# Patient Record
Sex: Male | Born: 2000 | Race: White | Hispanic: No | Marital: Single | Smoking: Current every day smoker
Health system: Southern US, Community
[De-identification: ages and names within clinical notes are randomized; demographics above are authoritative.]

## PROBLEM LIST (undated history)

## (undated) DIAGNOSIS — F909 Attention-deficit hyperactivity disorder, unspecified type: Secondary | ICD-10-CM

## (undated) DIAGNOSIS — F988 Other specified behavioral and emotional disorders with onset usually occurring in childhood and adolescence: Secondary | ICD-10-CM

---
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
## 2019-08-26 ENCOUNTER — Emergency Department (HOSPITAL_COMMUNITY)
Admission: EM | Admit: 2019-08-26 | Discharge: 2019-08-27 | Disposition: A | Discharge status: Home / Self Care | Payer: Medicaid Other | Attending: Emergency Medicine | Admitting: Emergency Medicine

## 2019-08-26 ENCOUNTER — Encounter (HOSPITAL_COMMUNITY): Payer: Self-pay | Admitting: Emergency Medicine

## 2019-08-26 ENCOUNTER — Other Ambulatory Visit: Payer: Self-pay

## 2019-08-26 DIAGNOSIS — F17210 Nicotine dependence, cigarettes, uncomplicated: Secondary | ICD-10-CM | POA: Diagnosis not present

## 2019-08-26 DIAGNOSIS — Z91010 Allergy to peanuts: Secondary | ICD-10-CM | POA: Insufficient documentation

## 2019-08-26 DIAGNOSIS — Z79899 Other long term (current) drug therapy: Secondary | ICD-10-CM | POA: Insufficient documentation

## 2019-08-26 DIAGNOSIS — K1230 Oral mucositis (ulcerative), unspecified: Secondary | ICD-10-CM | POA: Insufficient documentation

## 2019-08-26 DIAGNOSIS — R030 Elevated blood-pressure reading, without diagnosis of hypertension: Secondary | ICD-10-CM | POA: Diagnosis not present

## 2019-08-26 DIAGNOSIS — K121 Other forms of stomatitis: Secondary | ICD-10-CM

## 2019-08-26 DIAGNOSIS — R220 Localized swelling, mass and lump, head: Secondary | ICD-10-CM | POA: Diagnosis present

## 2019-08-26 NOTE — ED Triage Notes (Signed)
Pt reports lower lip swelling after chewing on his lip in his sleep.

## 2019-08-27 MED ORDER — AMOXICILLIN 500 MG PO CAPS: 1000.0000 mg | ORAL_CAPSULE | Freq: Two times a day (BID) | ORAL | 0 refills | Status: AC

## 2019-08-27 MED ORDER — AMOXICILLIN 250 MG PO CAPS
1000.0000 mg | ORAL_CAPSULE | Freq: Once | ORAL | Status: AC
  Administered 2019-08-27: 1000 mg via ORAL
  Filled 2019-08-27: qty 4

## 2019-08-27 NOTE — Discharge Instructions (Signed)
Suck on popsicles or other cool to cold items.    You may use topical anesthetics that are normally used for dental pain.  Your blood pressure was a little high today.  Please have it rechecked several times in the next 2 weeks.  If it stays high, you would need to be treated for it.

## 2019-08-27 NOTE — ED Provider Notes (Signed)
[facility] EMERGENCY DEPARTMENT Provider Note   CSN: [id number] Arrival date & time: 08/26/19  2118    History Chief Complaint  Patient presents with   Oral Swelling    John Cummings is a 19 y.o. male.  The history is provided by the patient.  He has history of attention deficit disorder and comes in having chewed his lower lip and his sleep causing an ulceration.  This occurred yesterday.  He came in today because of worsening pain.  He rates pain at 9/10.  He denies other injury.  He denies fever or chills.  Past Medical History:  Diagnosis Date   ADD (attention deficit disorder)    ADHD (attention deficit hyperactivity disorder)     There are no problems to display for this patient.   History reviewed. No pertinent surgical history.     No family history on file.  Social History   Tobacco Use   Smoking status: Current Every Day Smoker    Packs/day: 1.00    Types: Cigarettes   Smokeless tobacco: Never Used  Substance Use Topics   Alcohol use: No   Drug use: No    Home Medications Prior to Admission medications   Medication Sig Start Date End Date Taking? Authorizing Provider  ADDERALL XR 20 MG 24 hr capsule Take 20 mg by mouth 2 (two) times daily. 12/05/15   [provider]  ibuprofen (ADVIL,MOTRIN) 400 MG tablet Take 1 tablet (400 mg total) by mouth every 6 (six) hours as needed. 01/04/16   Ivery Quale, PA-C  methocarbamol (ROBAXIN) 500 MG tablet 1 at hs , or q6h prn spasm pain. 01/04/16   Ivery Quale, PA-C  sertraline (ZOLOFT) 50 MG tablet Take 50 mg by mouth daily. 11/05/15   [provider]    Allergies    Peanut-containing drug products  Review of Systems   Review of Systems  All other systems reviewed and are negative.   Physical Exam Updated Vital Signs BP (!) 132/99 (BP Location: Right Arm)    Pulse 100    Temp 98.3 F (36.8 C) (Oral)    Resp 19    Ht 6\' 2"  (1.88 m)    Wt 61.2 kg    SpO2 98%    BMI 17.33 kg/m    Physical Exam Vitals and nursing note reviewed.   19 year old male, resting comfortably and in no acute distress. Vital signs are significant for elevated diastolic blood pressure. Oxygen saturation is 98%, which is normal. Head is normocephalic and atraumatic. PERRLA, EOMI. there is approximately 1 cm ulceration of the mucosa of the left side of the lower lip. Neck is nontender and supple without adenopathy or JVD. Back is nontender and there is no CVA tenderness. Lungs are clear without rales, wheezes, or rhonchi. Chest is nontender. Heart has regular rate and rhythm without murmur. Abdomen is soft, flat, nontender without masses or hepatosplenomegaly and peristalsis is normoactive. Extremities have no cyanosis or edema, full range of motion is present. Skin is warm and dry without rash. Neurologic: Mental status is normal, cranial nerves are intact, there are no motor or sensory deficits.  ED Results / Procedures / Treatments    Procedures Procedures   Medications Ordered in ED Medications  amoxicillin (AMOXIL) capsule 1,000 mg (1,000 mg Oral Given 08/27/19 0045)    ED Course  I have reviewed the triage vital signs and the nursing notes.  MDM Rules/Calculators/A&P Oral ulceration, apparently secondary to patient chewing on his lip.  Appearance is not that of a viral aphthous ulcer.  He is given a prescription for amoxicillin and told to use topical anesthetics.  Mild elevation of blood pressure, advised to have it rechecked as an outpatient.  Final Clinical Impression(s) / ED Diagnoses Final diagnoses:  Oral ulceration  Elevated blood-pressure reading without diagnosis of hypertension    Rx / DC Orders ED Discharge Orders         Ordered    amoxicillin (AMOXIL) 500 MG capsule  2 times daily     08/27/19 2725           Delora Fuel, MD 36/64/40 [phone]
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
## 2023-09-25 ENCOUNTER — Encounter (HOSPITAL_COMMUNITY): Payer: Self-pay | Admitting: Emergency Medicine

## 2023-09-25 ENCOUNTER — Emergency Department (HOSPITAL_COMMUNITY)
Admission: EM | Admit: 2023-09-25 | Discharge: 2023-09-25 | Disposition: A | Discharge status: Home / Self Care | Attending: Emergency Medicine | Admitting: Emergency Medicine

## 2023-09-25 ENCOUNTER — Emergency Department (HOSPITAL_COMMUNITY)

## 2023-09-25 ENCOUNTER — Other Ambulatory Visit: Payer: Self-pay

## 2023-09-25 DIAGNOSIS — S93401A Sprain of unspecified ligament of right ankle, initial encounter: Secondary | ICD-10-CM | POA: Insufficient documentation

## 2023-09-25 DIAGNOSIS — S0990XA Unspecified injury of head, initial encounter: Secondary | ICD-10-CM | POA: Insufficient documentation

## 2023-09-25 DIAGNOSIS — Z23 Encounter for immunization: Secondary | ICD-10-CM | POA: Diagnosis not present

## 2023-09-25 DIAGNOSIS — M25511 Pain in right shoulder: Secondary | ICD-10-CM | POA: Insufficient documentation

## 2023-09-25 MED ORDER — TETANUS-DIPHTH-ACELL PERTUSSIS 5-2.5-18.5 LF-MCG/0.5 IM SUSY
0.5000 mL | PREFILLED_SYRINGE | Freq: Once | INTRAMUSCULAR | Status: AC
  Administered 2023-09-25: 0.5 mL via INTRAMUSCULAR
  Filled 2023-09-25: qty 0.5

## 2023-09-25 MED ORDER — IBUPROFEN 800 MG PO TABS: 800.0000 mg | ORAL_TABLET | Freq: Three times a day (TID) | ORAL | 0 refills | Status: AC | PRN
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
## 2023-09-25 NOTE — Discharge Instructions (Signed)
 Use your crutches to ambulate with.  Take Motrin  for pain.  Follow-up with your family doctor or Dr. Margrette and his colleagues in the next week if not improving
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
